# Patient Record
Sex: Male | Born: 2000
Health system: Southern US, Community
[De-identification: ages and names within clinical notes are randomized; demographics above are authoritative.]

---
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
## 2022-04-03 IMAGING — CR DG CHEST 2V
1 series · 2 of 2 positions shown · non-contrast
Comparison: 10/30/2008

CLINICAL DATA: Sudden onset left-sided chest pain and bilateral arm
and leg numbness.

EXAM:
CHEST - 2 VIEW

[Series 1: dg chest 2 view · 0.14mm/px · 2 of 2 slices shown]
[im 1/2]
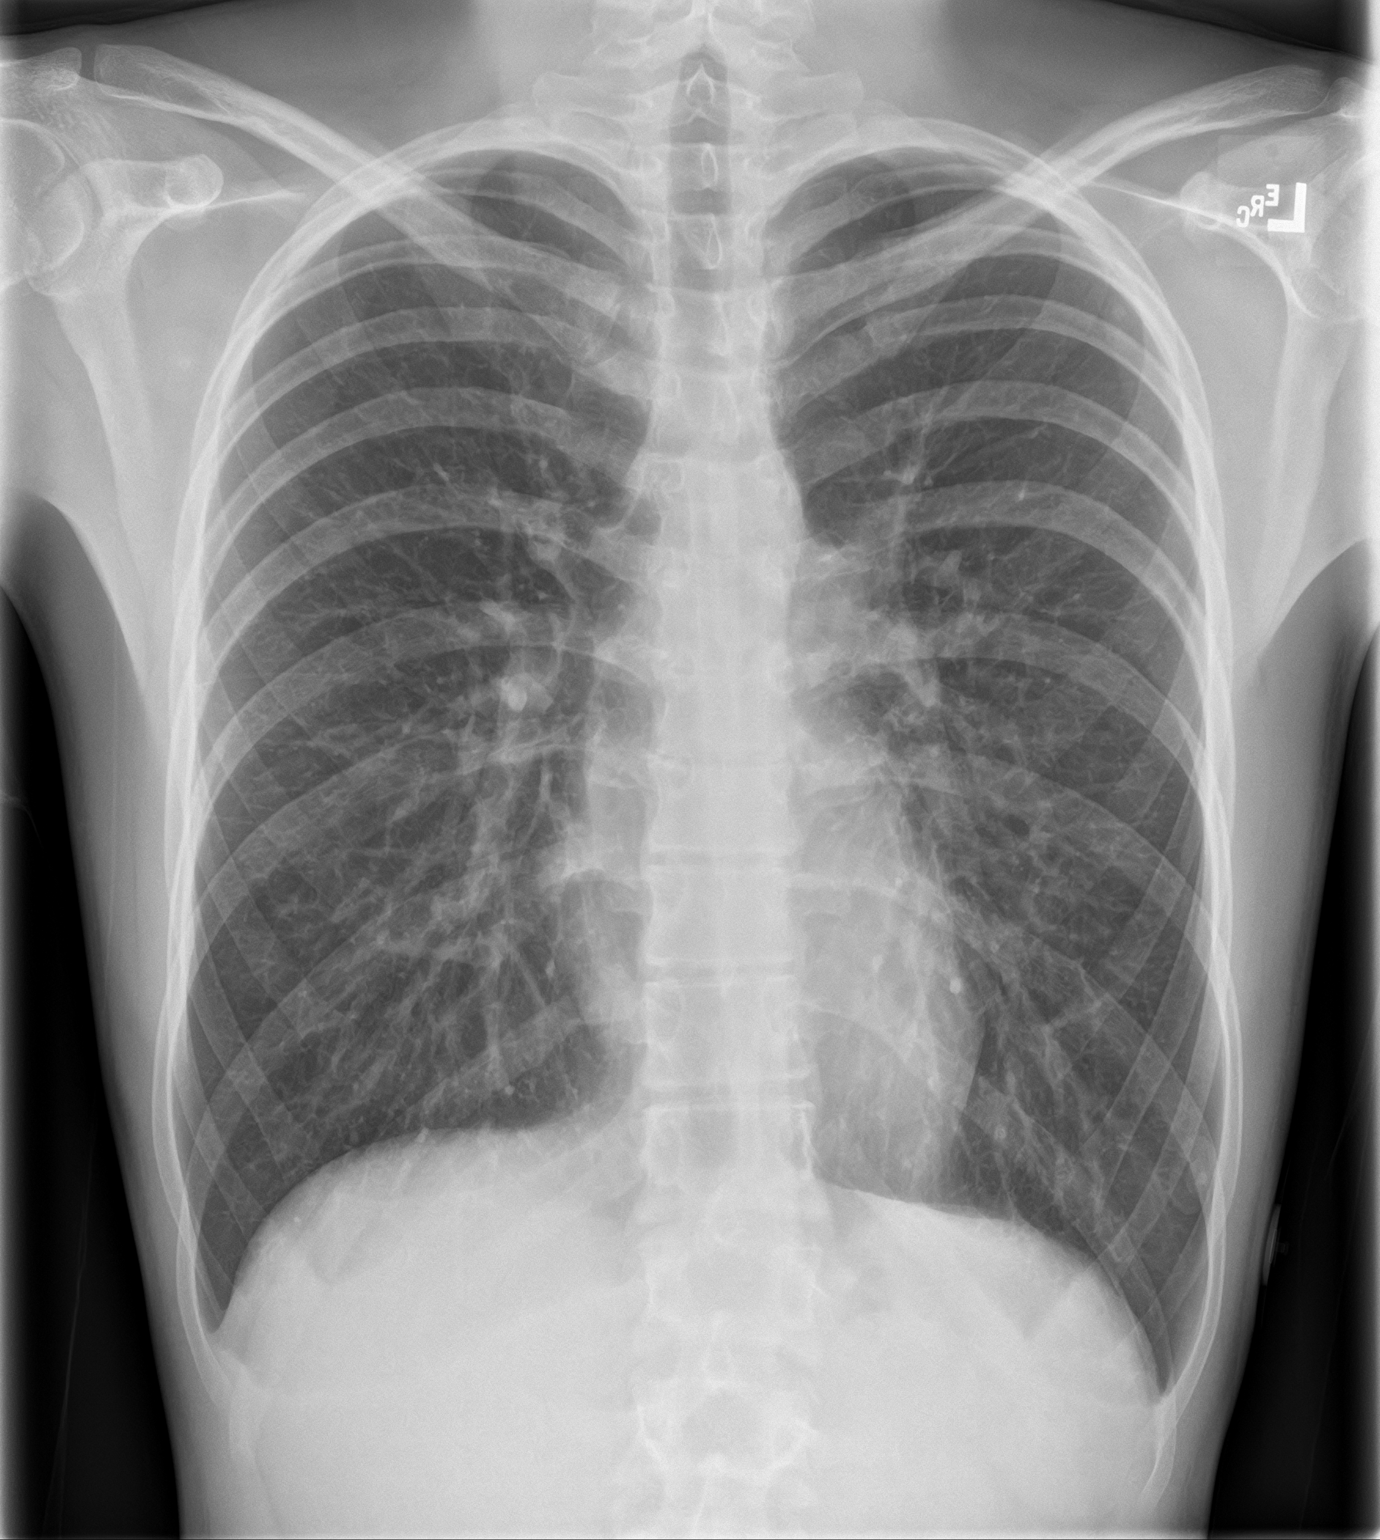
[im 2/2]
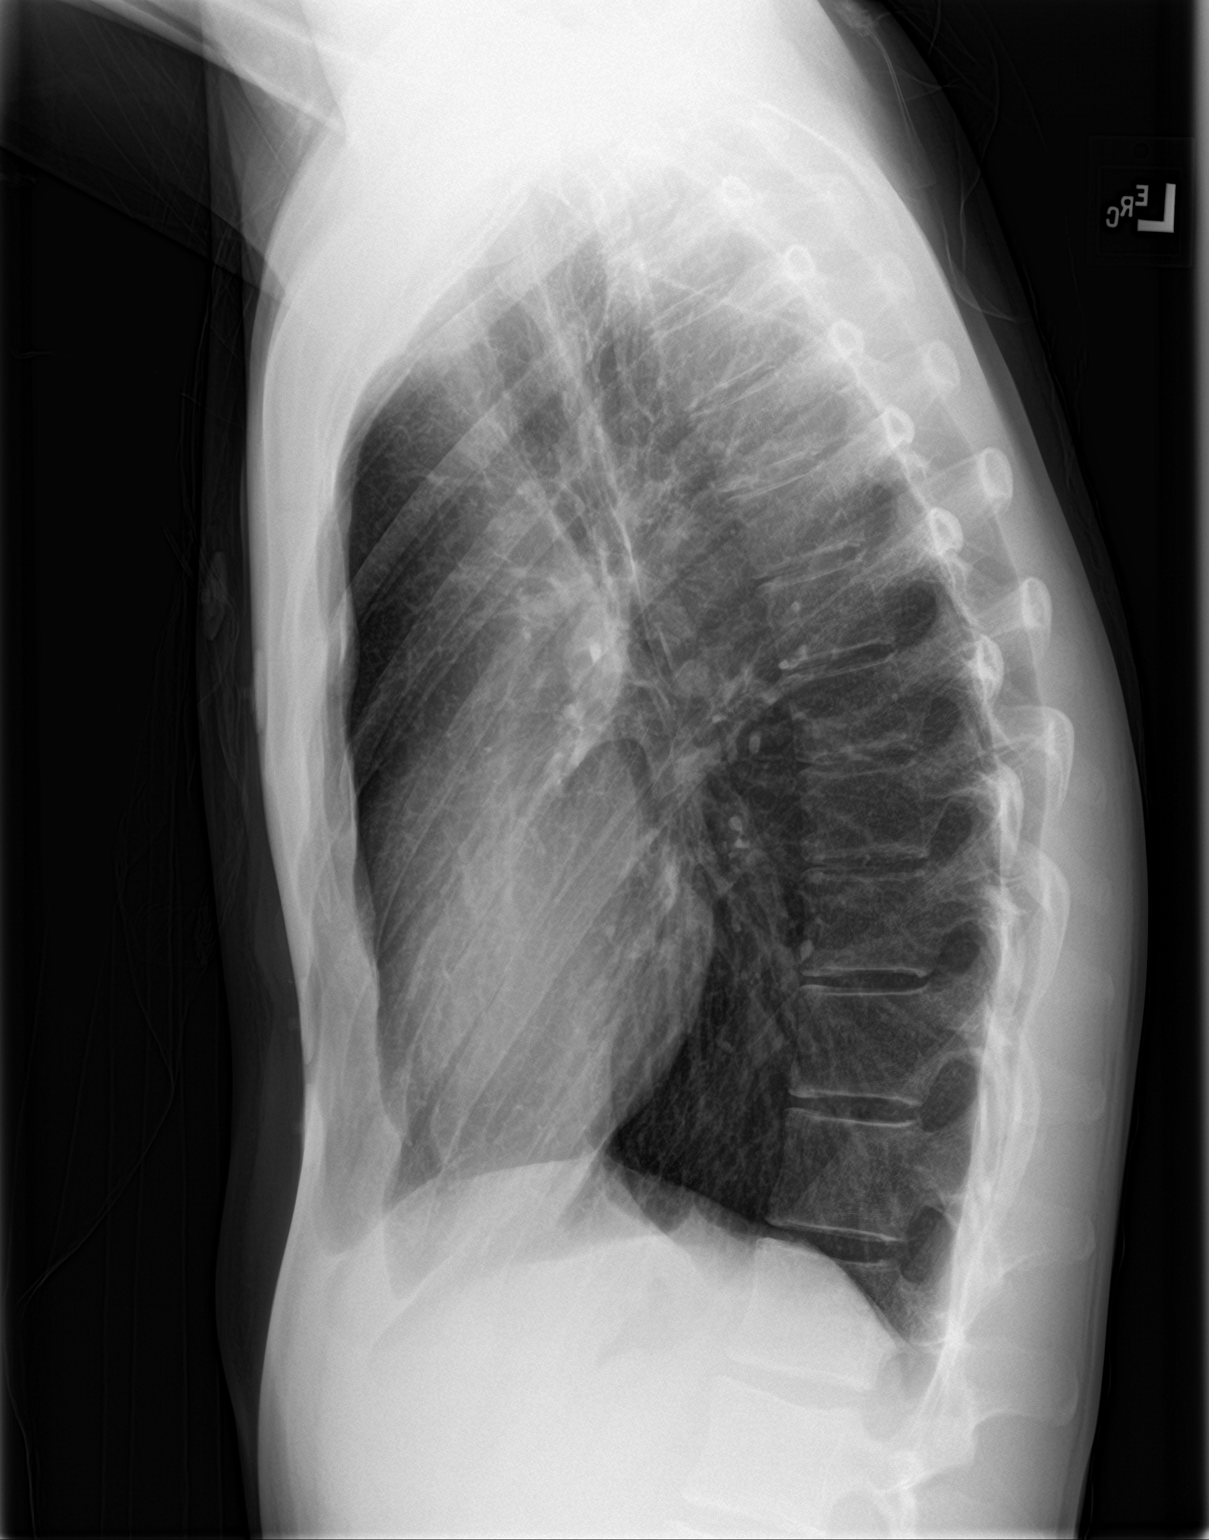

[2 of 2 positions shown; findings below may reference images not displayed]

FINDINGS: Mild hyperinflation of the lungs. Normal heart size and pulmonary
vascularity. No focal airspace disease or consolidation in the
lungs. No blunting of costophrenic angles. No pneumothorax.
Mediastinal contours appear intact.
IMPRESSION: No active cardiopulmonary disease.

## 2022-11-02 DIAGNOSIS — H5213 Myopia, bilateral: Secondary | ICD-10-CM | POA: Diagnosis not present

## 2022-12-23 ENCOUNTER — Ambulatory Visit
Admission: RE | Admit: 2022-12-23 | Discharge: 2022-12-23 | Disposition: A | Discharge status: Home / Self Care | Payer: Self-pay | Source: Ambulatory Visit | Attending: Physician Assistant | Admitting: Physician Assistant

## 2022-12-23 ENCOUNTER — Other Ambulatory Visit: Payer: Self-pay | Admitting: Physician Assistant

## 2022-12-23 DIAGNOSIS — Z0000 Encounter for general adult medical examination without abnormal findings: Secondary | ICD-10-CM
